# Patient Record
Sex: Male | Born: 1995 | Race: White | Hispanic: No | Marital: Single | Smoking: Never smoker
Health system: Southern US, Community
[De-identification: ages and names within clinical notes are randomized; demographics above are authoritative.]

---
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
## 2018-11-25 ENCOUNTER — Emergency Department: Payer: Self-pay

## 2018-11-25 ENCOUNTER — Emergency Department
Admission: EM | Admit: 2018-11-25 | Discharge: 2018-11-25 | Disposition: A | Discharge status: Home / Self Care | Payer: Self-pay | Attending: Emergency Medicine | Admitting: Emergency Medicine

## 2018-11-25 ENCOUNTER — Encounter: Payer: Self-pay | Admitting: *Deleted

## 2018-11-25 ENCOUNTER — Other Ambulatory Visit: Payer: Self-pay

## 2018-11-25 DIAGNOSIS — W228XXA Striking against or struck by other objects, initial encounter: Secondary | ICD-10-CM | POA: Insufficient documentation

## 2018-11-25 DIAGNOSIS — Y9289 Other specified places as the place of occurrence of the external cause: Secondary | ICD-10-CM | POA: Insufficient documentation

## 2018-11-25 DIAGNOSIS — Y998 Other external cause status: Secondary | ICD-10-CM | POA: Insufficient documentation

## 2018-11-25 DIAGNOSIS — M79671 Pain in right foot: Secondary | ICD-10-CM

## 2018-11-25 DIAGNOSIS — Y9389 Activity, other specified: Secondary | ICD-10-CM | POA: Insufficient documentation

## 2018-11-25 DIAGNOSIS — S99921A Unspecified injury of right foot, initial encounter: Secondary | ICD-10-CM | POA: Insufficient documentation

## 2018-11-25 MED ORDER — MELOXICAM 15 MG PO TABS: 15.0000 mg | ORAL_TABLET | Freq: Every day | ORAL | 1 refills | Status: AC

## 2018-11-25 NOTE — ED Notes (Signed)
Awaiting xray

## 2018-11-25 NOTE — ED Triage Notes (Signed)
Patient states he injured his right "foot/ankle" on Friday after swinging on a vine. Patient arrived on his own crutches and in a orthopedic boot from a previous injury.

## 2018-11-25 NOTE — ED Notes (Signed)
Patient reports swinging from vine in his yard on Saturday, vine broke and he fell onto his right foot. C/O of pain and swelling to right foot. Presents to ed with Neomia Dear boot and crutches. Awaiting to be seen by md.

## 2018-11-25 NOTE — ED Provider Notes (Signed)
[facility] Emergency Department Provider Note  ____________________________________________  Time seen: Approximately 9:45 PM  I have reviewed the triage vital signs and the nursing notes.   HISTORY  Chief Complaint Foot Injury    HPI Leonard Ayala is a 23 y.o. male presents to the emergency department with acute right foot pain for the past 24 hours.  Patient was at a birthday party for 1 of his cousins yesterday.  Younger cousins wanted to swing on a vine so patient tested vine out to see if it would hold the weight. Vine busted during a swing.  Patient reports that he has had worsening pain with attempted plantar flexion, dorsiflexion and movement of the toes.  He reports that his pain is been controlled well at home and he has not really been taking medication.  He has had multiple sprains of the right foot and ankle in the past.  No other alleviating measures have been attempted.   History reviewed. No pertinent past medical history.  There are no active problems to display for this patient.   History reviewed. No pertinent surgical history.  Prior to Admission medications   Medication Sig Start Date End Date Taking? Authorizing Provider  meloxicam (MOBIC) 15 MG tablet Take 1 tablet (15 mg total) by mouth daily for 7 days. 11/25/18 12/02/18  Orvil Feil, PA-C    Allergies Patient has no known allergies.  No family history on file.  Social History Social History   Tobacco Use  . Smoking status: Never Smoker  . Smokeless tobacco: Never Used  Substance Use Topics  . Alcohol use: Never    Frequency: Never  . Drug use: Never     Review of Systems  Constitutional: No fever/chills Eyes: No visual changes. No discharge ENT: No upper respiratory complaints. Cardiovascular: no chest pain. Respiratory: no cough. No SOB. Gastrointestinal: No abdominal pain.  No nausea, no vomiting.  No diarrhea.  No constipation. Genitourinary: Negative for  dysuria. No hematuria Musculoskeletal: Patient has right foot and ankle pain.  Skin: Negative for rash, abrasions, lacerations, ecchymosis. Neurological: Negative for headaches, focal weakness or numbness.   ____________________________________________   PHYSICAL EXAM:  VITAL SIGNS: ED Triage Vitals  Enc Vitals Group     BP 11/25/18 2001 (!) 147/93     Pulse Rate 11/25/18 2001 84     Resp 11/25/18 2001 18     Temp 11/25/18 2001 99 F (37.2 C)     Temp Source 11/25/18 2001 Oral     SpO2 11/25/18 2001 96 %     Weight 11/25/18 2002 145 lb (65.8 kg)     Height 11/25/18 2002 5\' 8"  (1.727 m)     Head Circumference --      Peak Flow --      Pain Score 11/25/18 2009 1     Pain Loc --      Pain Edu? --      Excl. in GC? --      Constitutional: Alert and oriented. Well appearing and in no acute distress. Eyes: Conjunctivae are normal. PERRL. EOMI. Head: Atraumatic. Cardiovascular: Normal rate, regular rhythm. Normal S1 and S2.  Good peripheral circulation. Respiratory: Normal respiratory effort without tachypnea or retractions. Lungs CTAB. Good air entry to the bases with no decreased or absent breath sounds. Musculoskeletal: Patient has difficulty performing plantar flexion at the right ankle.  There is no palpable deficit over the insertion for the right Achilles tendon.  Patient does have worsening pain  with passive dorsiflexion of the right foot.  He can move all 5 right toes.  He has no tenderness to palpation over the anterior and posterior talofibular ligaments or deltoid ligament.  Palpable dorsalis pedis pulse, right Neurologic:  Normal speech and language. No gross focal neurologic deficits are appreciated.  Skin: Patient has ecchymosis along the medial and lateral aspects of the right ankle. Psychiatric: Mood and affect are normal. Speech and behavior are normal. Patient exhibits appropriate insight and judgement.   ____________________________________________    LABS (all labs ordered are listed, but only abnormal results are displayed)  Labs Reviewed - No data to display ____________________________________________  EKG   ____________________________________________  RADIOLOGY I personally viewed and evaluated these images as part of my medical decision making, as well as reviewing the written report by the radiologist  Dg Ankle Complete Right  Result Date: 11/25/2018 CLINICAL DATA:  Ankle pain after fall EXAM: RIGHT ANKLE - COMPLETE 3+ VIEW COMPARISON:  None. FINDINGS: There is no evidence of fracture or dislocation. No soft tissue swelling. Small posterior ankle effusion. There is no evidence of arthropathy or other focal bone abnormality. Soft tissues are unremarkable. IMPRESSION: No fracture or dislocation of the right ankle. Electronically Signed   By: Deatra RobinsonKevin  Herman M.D.   On: 11/25/2018 21:18   Dg Foot Complete Right  Result Date: 11/25/2018 CLINICAL DATA:  Right foot pain. Fall. Bruising of lateral and medial malleoli. EXAM: RIGHT FOOT COMPLETE - 3+ VIEW COMPARISON:  None. FINDINGS: There is no evidence of fracture or dislocation. There is no evidence of arthropathy or other focal bone abnormality. Soft tissues are unremarkable. IMPRESSION: Negative. Electronically Signed   By: Deatra RobinsonKevin  Herman M.D.   On: 11/25/2018 21:08    ____________________________________________    PROCEDURES  Procedure(s) performed:    Procedures    Medications - No data to display   ____________________________________________   INITIAL IMPRESSION / ASSESSMENT AND PLAN / ED COURSE  Pertinent labs & imaging results that were available during my care of the patient were reviewed by me and considered in my medical decision making (see chart for details).  Review of the [place] CSRS was performed in accordance of the [facility] prior to dispensing any controlled drugs.      Assessment and plan Right foot pain Patient presents to the emergency department  with acute right foot and ankle pain after patient was swinging from a vine in Vine busted.  Differential diagnosis included partial Achilles tendon rupture, soleus rupture and foot sprain.  Patient has a boot from prior injury and advised patient to continue wearing it.  He was discharged with meloxicam.  All patient questions were answered.    ____________________________________________  FINAL CLINICAL IMPRESSION(S) / ED DIAGNOSES  Final diagnoses:  Right foot pain      NEW MEDICATIONS STARTED DURING THIS VISIT:  ED Discharge Orders         Ordered    meloxicam (MOBIC) 15 MG tablet  Daily     11/25/18 2143              This chart was dictated using voice recognition software/Dragon. Despite best efforts to proofread, errors can occur which can change the meaning. Any change was purely unintentional.    Orvil FeilWoods, Lativia Velie M, PA-C 11/25/18 2153    Dionne BucySiadecki, Sebastian, MD 11/25/18 [phone]

## 2020-12-26 IMAGING — DX RIGHT ANKLE - COMPLETE 3+ VIEW
3 series · 3 of 3 positions shown · non-contrast
Comparison: None.

CLINICAL DATA: Ankle pain after fall

EXAM:
RIGHT ANKLE - COMPLETE 3+ VIEW

[ankle ap]
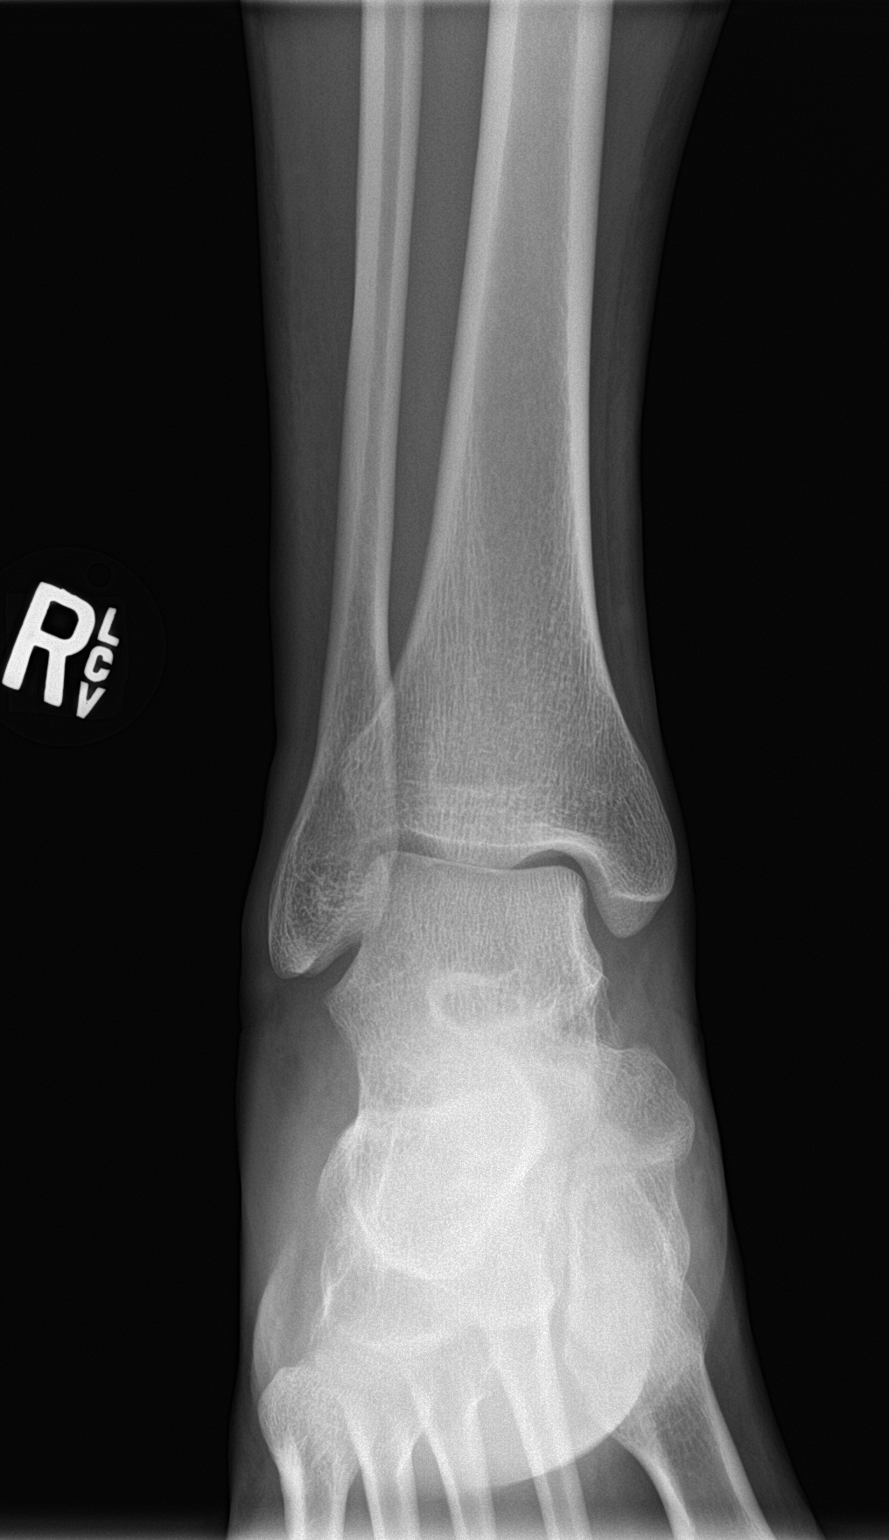

[ankle obl]
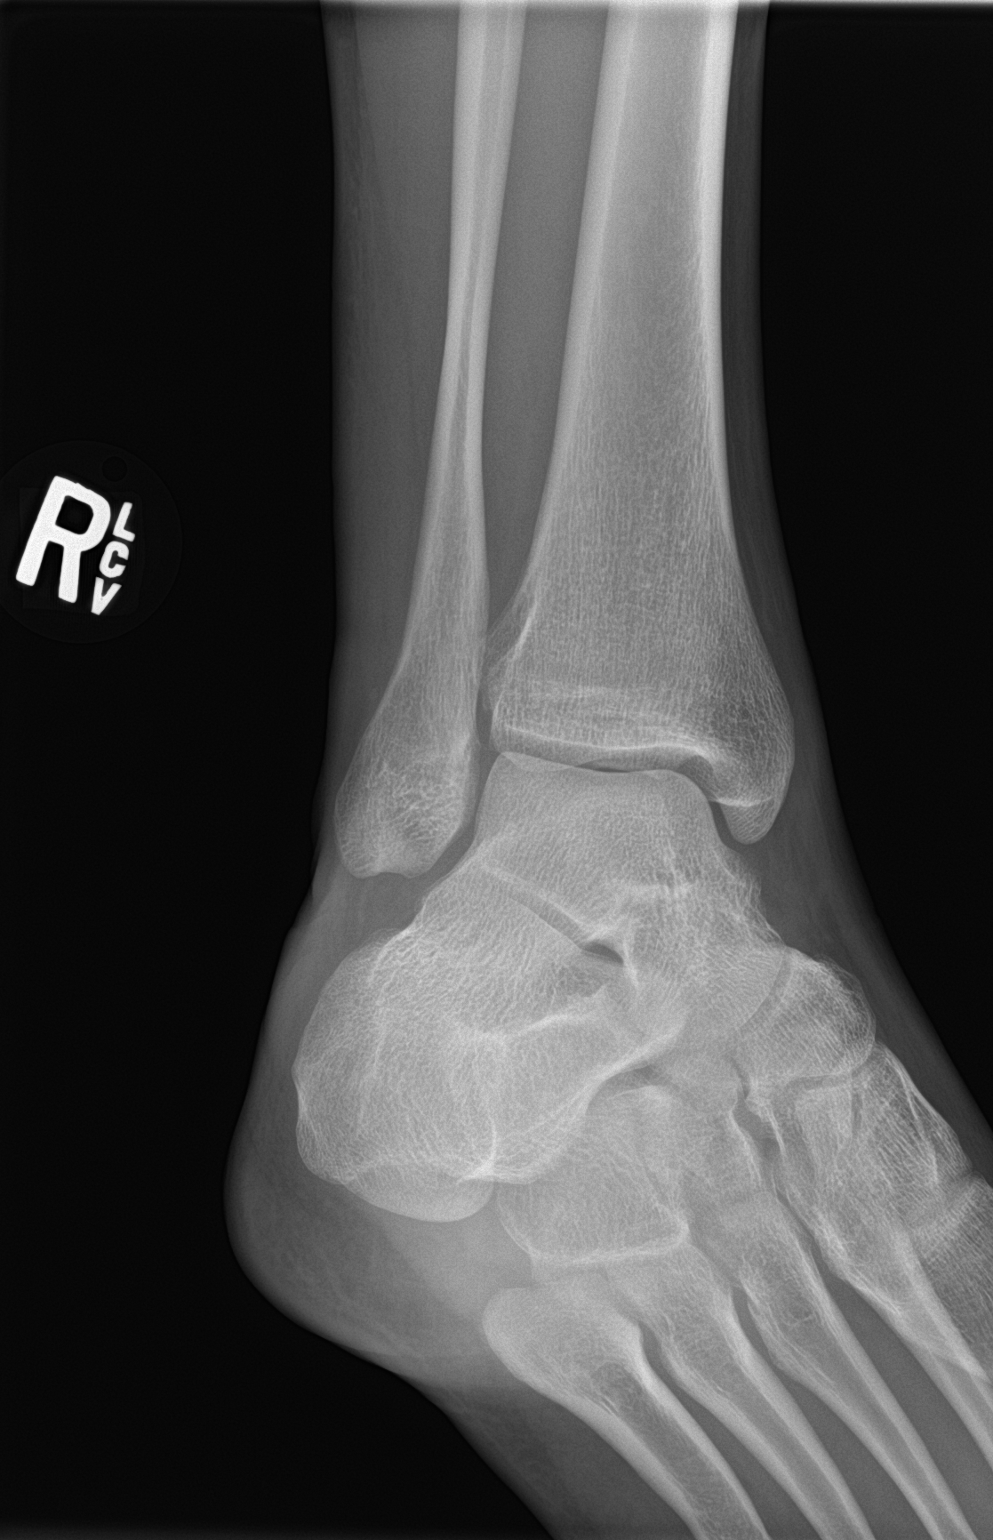

[ankle lat]
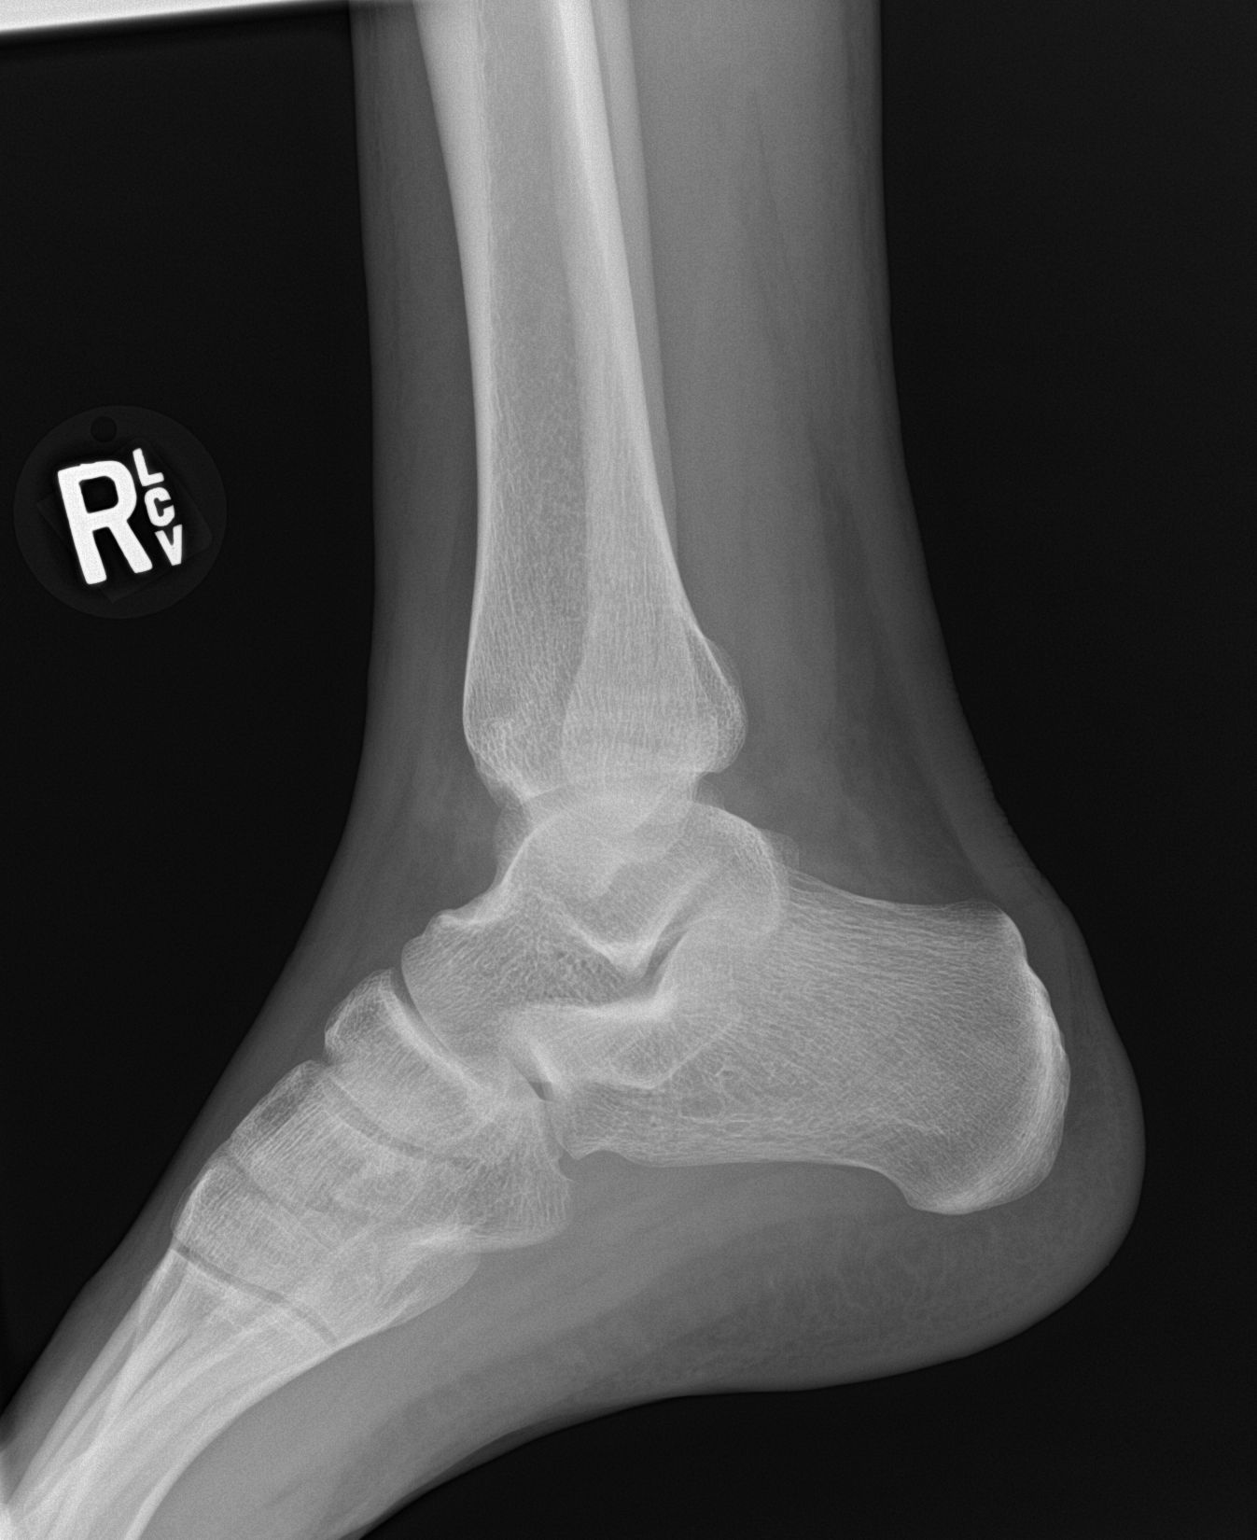

[3 of 3 positions shown; findings below may reference images not displayed]

FINDINGS: There is no evidence of fracture or dislocation. No soft tissue
swelling. Small posterior ankle effusion. There is no evidence of
arthropathy or other focal bone abnormality. Soft tissues are
unremarkable.
IMPRESSION: No fracture or dislocation of the right ankle.
